# Patient Record
Sex: Male | Born: 1990 | Race: White | Hispanic: No | Marital: Single | Smoking: Never smoker
Health system: Southern US, Community
[De-identification: ages and names within clinical notes are randomized; demographics above are authoritative.]

## PROBLEM LIST (undated history)

## (undated) HISTORY — PX: SPLENECTOMY: SUR1306

## (undated) HISTORY — PX: ABDOMINAL SURGERY: SHX537

---
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
## 2010-05-20 ENCOUNTER — Emergency Department: Payer: Self-pay | Admitting: Emergency Medicine
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
## 2011-04-27 ENCOUNTER — Emergency Department: Payer: Self-pay | Admitting: Emergency Medicine

## 2017-05-13 ENCOUNTER — Emergency Department: Payer: Medicaid Other

## 2017-05-13 ENCOUNTER — Encounter: Payer: Self-pay | Admitting: Emergency Medicine

## 2017-05-13 ENCOUNTER — Emergency Department
Admission: EM | Admit: 2017-05-13 | Discharge: 2017-05-13 | Disposition: A | Discharge status: Home / Self Care | Payer: Medicaid Other | Attending: Emergency Medicine | Admitting: Emergency Medicine

## 2017-05-13 DIAGNOSIS — Y9389 Activity, other specified: Secondary | ICD-10-CM | POA: Diagnosis not present

## 2017-05-13 DIAGNOSIS — W230XXA Caught, crushed, jammed, or pinched between moving objects, initial encounter: Secondary | ICD-10-CM | POA: Diagnosis not present

## 2017-05-13 DIAGNOSIS — Z23 Encounter for immunization: Secondary | ICD-10-CM | POA: Diagnosis not present

## 2017-05-13 DIAGNOSIS — Y92009 Unspecified place in unspecified non-institutional (private) residence as the place of occurrence of the external cause: Secondary | ICD-10-CM | POA: Insufficient documentation

## 2017-05-13 DIAGNOSIS — Y998 Other external cause status: Secondary | ICD-10-CM | POA: Insufficient documentation

## 2017-05-13 DIAGNOSIS — S61213A Laceration without foreign body of left middle finger without damage to nail, initial encounter: Secondary | ICD-10-CM | POA: Diagnosis not present

## 2017-05-13 DIAGNOSIS — S6992XA Unspecified injury of left wrist, hand and finger(s), initial encounter: Secondary | ICD-10-CM | POA: Diagnosis present

## 2017-05-13 DIAGNOSIS — T148XXA Other injury of unspecified body region, initial encounter: Secondary | ICD-10-CM

## 2017-05-13 MED ORDER — TETANUS-DIPHTH-ACELL PERTUSSIS 5-2.5-18.5 LF-MCG/0.5 IM SUSP
0.5000 mL | Freq: Once | INTRAMUSCULAR | Status: AC
  Administered 2017-05-13: 0.5 mL via INTRAMUSCULAR

## 2017-05-13 MED ORDER — CEPHALEXIN 500 MG PO CAPS: 500.0000 mg | ORAL_CAPSULE | Freq: Four times a day (QID) | ORAL | 0 refills | Status: AC

## 2017-05-13 MED ORDER — MELOXICAM 7.5 MG PO TABS: 7.5000 mg | ORAL_TABLET | Freq: Every day | ORAL | 1 refills | Status: AC

## 2017-05-13 MED ORDER — TETANUS-DIPHTH-ACELL PERTUSSIS 5-2.5-18.5 LF-MCG/0.5 IM SUSP
INTRAMUSCULAR | Status: AC
  Administered 2017-05-13: 0.5 mL via INTRAMUSCULAR
  Filled 2017-05-13: qty 0.5

## 2017-05-13 NOTE — ED Triage Notes (Signed)
Patient presents to the ED with injured middle finger on his left hand.  Patient states, "I was doing some work around my house with a bobcat and it trapped my finger and I pulled it away."  Patient is in no obvious distress at this time.

## 2017-05-13 NOTE — ED Notes (Signed)
Pt discharged to home.  Family member driving.  Discharge instructions reviewed.  Verbalized understanding.  No questions or concerns at this time.  Teach back verified.  Pt in NAD.  No items left in ED.  Signature pad not working.

## 2017-05-13 NOTE — ED Provider Notes (Signed)
[facility] Emergency Department Provider Note  ____________________________________________  Time seen: Approximately 4:02 PM  I have reviewed the triage vital signs and the nursing notes.   HISTORY  Chief Complaint Finger Injury    HPI Barry Fitzgerald is a 26 y.o. male presents to the emergency department with left middle finger pain. Patient states that he accidentally caught his left middle finger in a bobcat resulting in a 1 cm skin avulsion. Patient applied a clean dressing but has attempted no other alleviating measures. Patient denies weakness of the left hand.   History reviewed. No pertinent past medical history.  There are no active problems to display for this patient.   History reviewed. No pertinent surgical history.  Prior to Admission medications   Medication Sig Start Date End Date Taking? Authorizing Provider  cephALEXin (KEFLEX) 500 MG capsule Take 1 capsule (500 mg total) by mouth 4 (four) times daily. 05/13/17 05/23/17  Orvil FeilWoods, Maylyn Narvaiz M, PA-C  meloxicam (MOBIC) 7.5 MG tablet Take 1 tablet (7.5 mg total) by mouth daily. 05/13/17 05/20/17  Orvil FeilWoods, Nyiah Pianka M, PA-C    Allergies Patient has no known allergies.  No family history on file.  Social History Social History  Substance Use Topics  . Smoking status: Never Smoker  . Smokeless tobacco: Never Used  . Alcohol use No     Review of Systems  Constitutional: No fever/chills Eyes: No visual changes. No discharge ENT: No upper respiratory complaints. Cardiovascular: no chest pain. Respiratory: no cough. No SOB. Musculoskeletal: Patient has left middle finger pain.  Skin: Patient has 1 cm skin avulsion of left middle finger. Neurological: Negative for headaches, focal weakness or numbness.   ____________________________________________   PHYSICAL EXAM:  VITAL SIGNS: ED Triage Vitals  Enc Vitals Group     BP 05/13/17 1338 113/69     Pulse Rate 05/13/17 1338 (!) 55     Resp  05/13/17 1338 16     Temp 05/13/17 1338 97.7 F (36.5 C)     Temp Source 05/13/17 1338 Oral     SpO2 05/13/17 1338 100 %     Weight 05/13/17 1339 170 lb (77.1 kg)     Height 05/13/17 1339 6' (1.829 m)     Head Circumference --      Peak Flow --      Pain Score 05/13/17 1337 8     Pain Loc --      Pain Edu? --      Excl. in GC? --      Constitutional: Alert and oriented. Well appearing and in no acute distress. Eyes: Conjunctivae are normal. PERRL. EOMI. Head: Atraumatic. Cardiovascular: Normal rate, regular rhythm. Normal S1 and S2.  Good peripheral circulation. Respiratory: Normal respiratory effort without tachypnea or retractions. Lungs CTAB. Good air entry to the bases with no decreased or absent breath sounds. Musculoskeletal: Patient is able to move all 5 left fingers. Patient is able to perform resisted flexion and extension at the PIP and DIP joints of the left middle finger. Palpable radial and ulnar pulses bilaterally and symmetrically. Neurologic:  Normal speech and language. No gross focal neurologic deficits are appreciated.  Skin: She has 1 cm skin avulsion of left middle finger. Psychiatric: Mood and affect are normal. Speech and behavior are normal. Patient exhibits appropriate insight and judgement.   ____________________________________________   LABS (all labs ordered are listed, but only abnormal results are displayed)  Labs Reviewed - No data to display ____________________________________________  EKG  ____________________________________________  RADIOLOGY  Geraldo Pitter, personally viewed and evaluated these images (plain radiographs) as part of my medical decision making, as well as reviewing the written report by the radiologist.  Dg Finger Middle Left  Result Date: 05/13/2017 CLINICAL DATA:  Patient presents to the ED with injured middle finger on his left hand. Patient states, "I was doing some work around my house with a bobcat and it  trapped my finger and I pulled it away." Patient is in no obvious distress at this time. EXAM: LEFT MIDDLE FINGER 2+V COMPARISON:  None FINDINGS: There is no evidence of fracture or dislocation. There is no evidence of arthropathy or other focal bone abnormality. Soft tissues are unremarkable. IMPRESSION: No acute osseous injury of the left third digit. Electronically Signed   By: Elige Ko   On: 05/13/2017 14:20    ____________________________________________    PROCEDURES  Procedure(s) performed:    Procedures    Medications - No data to display   ____________________________________________   INITIAL IMPRESSION / ASSESSMENT AND PLAN / ED COURSE  Pertinent labs & imaging results that were available during my care of the patient were reviewed by me and considered in my medical decision making (see chart for details).  Review of the [place] CSRS was performed in accordance of the [facility] prior to dispensing any controlled drugs.    Assessment and plan Avulsion Patient presents to the emergency department with left middle finger pain after he caught finger in a bobcat. Physical exam was reassuring. DG left middle finger revealed no acute bony abnormality. Basic wound care was provided in the emergency department. Patient's tetanus status was updated in the emergency department. Patient was discharged with Keflex. Vital signs were reassuring prior to discharge. All patient questions were answered.   ____________________________________________  FINAL CLINICAL IMPRESSION(S) / ED DIAGNOSES  Final diagnoses:  Skin avulsion      NEW MEDICATIONS STARTED DURING THIS VISIT:  New Prescriptions   CEPHALEXIN (KEFLEX) 500 MG CAPSULE    Take 1 capsule (500 mg total) by mouth 4 (four) times daily.   MELOXICAM (MOBIC) 7.5 MG TABLET    Take 1 tablet (7.5 mg total) by mouth daily.        This chart was dictated using voice recognition software/Dragon. Despite best efforts to  proofread, errors can occur which can change the meaning. Any change was purely unintentional.    Orvil Feil, PA-C 05/13/17 1611    Merrily Brittle, MD 05/13/17 [phone]

## 2017-08-02 ENCOUNTER — Emergency Department
Admission: EM | Admit: 2017-08-02 | Discharge: 2017-08-02 | Disposition: A | Discharge status: Home / Self Care | Payer: Medicaid Other | Attending: Emergency Medicine | Admitting: Emergency Medicine

## 2017-08-02 ENCOUNTER — Encounter: Payer: Self-pay | Admitting: Emergency Medicine

## 2017-08-02 DIAGNOSIS — R600 Localized edema: Secondary | ICD-10-CM | POA: Insufficient documentation

## 2017-08-02 DIAGNOSIS — K0889 Other specified disorders of teeth and supporting structures: Secondary | ICD-10-CM | POA: Diagnosis not present

## 2017-08-02 DIAGNOSIS — R220 Localized swelling, mass and lump, head: Secondary | ICD-10-CM

## 2017-08-02 MED ORDER — TRAMADOL HCL 50 MG PO TABS: 50.0000 mg | ORAL_TABLET | Freq: Four times a day (QID) | ORAL | 0 refills | Status: DC | PRN

## 2017-08-02 MED ORDER — AMOXICILLIN 875 MG PO TABS: 875.0000 mg | ORAL_TABLET | Freq: Two times a day (BID) | ORAL | 0 refills | Status: DC

## 2017-08-02 MED ORDER — IBUPROFEN 800 MG PO TABS: 800.0000 mg | ORAL_TABLET | Freq: Three times a day (TID) | ORAL | 0 refills | Status: DC | PRN

## 2017-08-02 MED ORDER — TRAMADOL HCL 50 MG PO TABS
50.0000 mg | ORAL_TABLET | Freq: Once | ORAL | Status: AC
  Administered 2017-08-02: 50 mg via ORAL
  Filled 2017-08-02: qty 1

## 2017-08-02 MED ORDER — AMOXICILLIN 500 MG PO CAPS
500.0000 mg | ORAL_CAPSULE | Freq: Once | ORAL | Status: AC
  Administered 2017-08-02: 500 mg via ORAL
  Filled 2017-08-02: qty 1

## 2017-08-02 NOTE — ED Provider Notes (Signed)
[facility] EMERGENCY DEPARTMENT Provider Note   CSN: [id number] Arrival date & time: 08/02/17  2010     History   Chief Complaint Chief Complaint  Patient presents with  . Oral Swelling    HPI Barry Fitzgerald is a 26 y.o. male presents to the emergency department for evaluation of left-sided facial swelling.  Patient has swelling on his left lower jaw.  He has a cracked left second lower molar that is been broken for 1 year.  Patient states he developed pain yesterday but today developed swelling and increased pain.  Pain is currently 6 out of 10.  No fevers or difficulty swallowing.  He denies any chest pain, shortness of breath.  No tongue swelling.  He is not take any medications for pain or antibiotics.  He has a dental appointment on Monday.  HPI  History reviewed. No pertinent past medical history.  There are no active problems to display for this patient.   History reviewed. No pertinent surgical history.     Home Medications    Prior to Admission medications   Medication Sig Start Date End Date Taking? Authorizing Provider  amoxicillin (AMOXIL) 875 MG tablet Take 1 tablet (875 mg total) by mouth 2 (two) times daily. X 10 days 08/02/17   Evon Slack, PA-C  ibuprofen (ADVIL,MOTRIN) 800 MG tablet Take 1 tablet (800 mg total) by mouth every 8 (eight) hours as needed. 08/02/17   Evon Slack, PA-C  traMADol (ULTRAM) 50 MG tablet Take 1 tablet (50 mg total) by mouth every 6 (six) hours as needed. 08/02/17   Evon Slack, PA-C    Family History No family history on file.  Social History Social History  Substance Use Topics  . Smoking status: Never Smoker  . Smokeless tobacco: Never Used  . Alcohol use No     Allergies   Patient has no known allergies.   Review of Systems Review of Systems  Constitutional: Negative.  Negative for chills and fever.  HENT: Positive for dental problem and facial swelling. Negative for drooling,  mouth sores, trouble swallowing and voice change.   Respiratory: Negative for shortness of breath.   Cardiovascular: Negative for chest pain.  Gastrointestinal: Negative for nausea and vomiting.  Musculoskeletal: Negative for arthralgias, neck pain and neck stiffness.  Skin: Negative.   Psychiatric/Behavioral: Negative for confusion.  All other systems reviewed and are negative.    Physical Exam Updated Vital Signs BP 123/64 (BP Location: Left Arm)   Pulse 60   Temp 98.4 F (36.9 C) (Oral)   Resp 20   Ht 6' (1.829 m)   Wt 77.1 kg (170 lb)   SpO2 99%   BMI 23.06 kg/m   Physical Exam  Constitutional: He is oriented to person, place, and time. He appears well-developed and well-nourished.  HENT:  Head: Normocephalic and atraumatic.  Right Ear: External ear normal.  Left Ear: External ear normal.  Nose: Nose normal.  Mouth/Throat: No oropharyngeal exudate.  No intraoral lesions, abscess.  Left lower second molar is fractured with no surrounding erythema or swelling.  Left lower second molar is tender to palpation.  Eyes: Conjunctivae are normal.  Neck: Normal range of motion.  Cardiovascular: Normal rate.   Pulmonary/Chest: Effort normal. No respiratory distress.  Musculoskeletal: Normal range of motion.  Lymphadenopathy:    He has no cervical adenopathy.  Neurological: He is alert and oriented to person, place, and time.  Skin: Skin is warm. No rash noted.  Psychiatric: He has a normal mood and affect. His behavior is normal. Thought content normal.     ED Treatments / Results  Labs (all labs ordered are listed, but only abnormal results are displayed) Labs Reviewed - No data to display  EKG  EKG Interpretation None       Radiology No results found.  Procedures Procedures (including critical care time)  Medications Ordered in ED Medications  amoxicillin (AMOXIL) capsule 500 mg (500 mg Oral Given 08/02/17 2047)  traMADol (ULTRAM) tablet 50 mg (50 mg Oral  Given 08/02/17 2048)     Initial Impression / Assessment and Plan / ED Course  I have reviewed the triage vital signs and the nursing notes.  Pertinent labs & imaging results that were available during my care of the patient were reviewed by me and considered in my medical decision making (see chart for details).     26 year old male with left lower second molar dental pain with infection.  He is started on amoxicillin and given tramadol and ibuprofen for pain relief.  He will follow-up with the dental clinic on Monday.  He is educated on signs and symptoms return to the ED for.  Final Clinical Impressions(s) / ED Diagnoses   Final diagnoses:  Left facial swelling  Pain, dental    New Prescriptions New Prescriptions   AMOXICILLIN (AMOXIL) 875 MG TABLET    Take 1 tablet (875 mg total) by mouth 2 (two) times daily. X 10 days   IBUPROFEN (ADVIL,MOTRIN) 800 MG TABLET    Take 1 tablet (800 mg total) by mouth every 8 (eight) hours as needed.   TRAMADOL (ULTRAM) 50 MG TABLET    Take 1 tablet (50 mg total) by mouth every 6 (six) hours as needed.     Ronnette JuniperGaines, Thomas C, PA-C 08/02/17 2107    Nita SickleVeronese, [place], MD 08/02/17 2155

## 2017-08-02 NOTE — ED Triage Notes (Signed)
Pt reports yesterday woke up with some discomfort to his mouth reports today at lunch noticed swelling to left side of mouth,reports pain to left lower tooth denies any other symptom pt talks in complete sentences no distress noted.

## 2017-08-02 NOTE — Discharge Instructions (Signed)
Please take antibiotics as prescribed.  Take tramadol and ibuprofen as needed for pain.  Return to the emergency department for any fevers, increasing pain or swelling.  Follow-up with dental clinic on Monday.

## 2017-08-03 ENCOUNTER — Encounter: Payer: Self-pay | Admitting: Emergency Medicine

## 2017-08-03 ENCOUNTER — Emergency Department
Admission: EM | Admit: 2017-08-03 | Discharge: 2017-08-03 | Disposition: A | Discharge status: Home / Self Care | Payer: Medicaid Other | Attending: Student in an Organized Health Care Education/Training Program | Admitting: Student in an Organized Health Care Education/Training Program

## 2017-08-03 DIAGNOSIS — Y929 Unspecified place or not applicable: Secondary | ICD-10-CM | POA: Diagnosis not present

## 2017-08-03 DIAGNOSIS — S025XXA Fracture of tooth (traumatic), initial encounter for closed fracture: Secondary | ICD-10-CM | POA: Diagnosis not present

## 2017-08-03 DIAGNOSIS — Y939 Activity, unspecified: Secondary | ICD-10-CM | POA: Insufficient documentation

## 2017-08-03 DIAGNOSIS — K0889 Other specified disorders of teeth and supporting structures: Secondary | ICD-10-CM

## 2017-08-03 DIAGNOSIS — Y998 Other external cause status: Secondary | ICD-10-CM | POA: Insufficient documentation

## 2017-08-03 DIAGNOSIS — Y33XXXA Other specified events, undetermined intent, initial encounter: Secondary | ICD-10-CM | POA: Insufficient documentation

## 2017-08-03 MED ORDER — IBUPROFEN 800 MG PO TABS
800.0000 mg | ORAL_TABLET | Freq: Once | ORAL | Status: AC
  Administered 2017-08-03: 800 mg via ORAL
  Filled 2017-08-03: qty 1

## 2017-08-03 MED ORDER — AMOXICILLIN 500 MG PO CAPS
500.0000 mg | ORAL_CAPSULE | Freq: Once | ORAL | Status: AC
  Administered 2017-08-03: 500 mg via ORAL
  Filled 2017-08-03: qty 1

## 2017-08-03 MED ORDER — OXYCODONE-ACETAMINOPHEN 5-325 MG PO TABS
1.0000 | ORAL_TABLET | Freq: Once | ORAL | Status: AC
  Administered 2017-08-03: 1 via ORAL
  Filled 2017-08-03: qty 1

## 2017-08-03 NOTE — ED Triage Notes (Signed)
States was seen for this, provided antibiotics and pain meds, told to recheck for cont swelling, here for recheck. Patient neg for sepsis screen.

## 2017-08-03 NOTE — ED Provider Notes (Signed)
[facility] Emergency Department Provider Note   ____________________________________________   First MD Initiated Contact with Patient 08/03/17 [phone]     (approximate)  I have reviewed the triage vital signs and the nursing notes.   HISTORY  Chief Complaint Dental Pain    HPI Barry Fitzgerald is a 26 y.o. male patient presents with dental pain and swelling to the left lower jaw. Patient was seen at this facility last night for same complaint and was given a prescription for antibiotics and pain medication. Patient state pharmacy was closed before he could get  the prescriptions filled. Patient awakened this morning with increased pain and swelling. Patient denies fever associated this complaint. Patient rates his pain as 10 over 10.  History reviewed. No pertinent past medical history.  There are no active problems to display for this patient.   History reviewed. No pertinent surgical history.  Prior to Admission medications   Medication Sig Start Date End Date Taking? Authorizing Provider  amoxicillin (AMOXIL) 875 MG tablet Take 1 tablet (875 mg total) by mouth 2 (two) times daily. X 10 days 08/02/17   Evon SlackGaines, Thomas C, PA-C  ibuprofen (ADVIL,MOTRIN) 800 MG tablet Take 1 tablet (800 mg total) by mouth every 8 (eight) hours as needed. 08/02/17   Evon SlackGaines, Thomas C, PA-C  traMADol (ULTRAM) 50 MG tablet Take 1 tablet (50 mg total) by mouth every 6 (six) hours as needed. 08/02/17   Evon SlackGaines, Thomas C, PA-C    Allergies Patient has no known allergies.  No family history on file.  Social History Social History  Substance Use Topics  . Smoking status: Never Smoker  . Smokeless tobacco: Never Used  . Alcohol use No    Review of Systems Constitutional: No fever/chills Eyes: No visual changes. ENT: No sore throat. Dental pain and swelling Cardiovascular: Denies chest pain. Respiratory: Denies shortness of breath. Gastrointestinal: No abdominal pain.  No  nausea, no vomiting.  No diarrhea.  No constipation. Genitourinary: Negative for dysuria. Musculoskeletal: Negative for back pain. Skin: Negative for rash. Neurological: Negative for headaches, focal weakness or numbness.   ____________________________________________   PHYSICAL EXAM:  VITAL SIGNS: ED Triage Vitals  Enc Vitals Group     BP 08/03/17 0703 115/72     Pulse Rate 08/03/17 0703 71     Resp 08/03/17 0703 18     Temp 08/03/17 0703 99.6 F (37.6 C)     Temp Source 08/03/17 0703 Oral     SpO2 08/03/17 0703 100 %     Weight 08/03/17 0706 170 lb (77.1 kg)     Height 08/03/17 0706 6' (1.829 m)     Head Circumference --      Peak Flow --      Pain Score 08/03/17 0703 10     Pain Loc --      Pain Edu? --      Excl. in GC? --    Constitutional: Alert and oriented. Well appearing and in no acute distress. Mouth/Throat: Mucous membranes are moist.  Oropharynx non-erythematous. Edematous and erythematous gingiva around fractured tooth #19 and 20. Neck: No stridor.  No cervical spine tenderness to palpation. Hematological/Lymphatic/Immunilogical: No cervical lymphadenopathy. Cardiovascular: Normal rate, regular rhythm. Grossly normal heart sounds.  Good peripheral circulation. Respiratory: Normal respiratory effort.  No retractions. Lungs CTAB. Neurologic:  Normal speech and language. No gross focal neurologic deficits are appreciated. No gait instability. Skin:  Skin is warm, dry and intact. No rash noted. Psychiatric: Mood and  affect are normal. Speech and behavior are normal.  ____________________________________________   LABS (all labs ordered are listed, but only abnormal results are displayed)  Labs Reviewed - No data to display ____________________________________________  EKG   ____________________________________________  RADIOLOGY  No results found.  ____________________________________________   PROCEDURES  Procedure(s) performed:  None  Procedures  Critical Care performed: No  ____________________________________________   INITIAL IMPRESSION / ASSESSMENT AND PLAN / ED COURSE  As part of my medical decision making, I reviewed the following data within the electronic MEDICAL RECORD NUMBER    Dental pain secondary to fractured tooth. Patient given discharge Instructions and advised to have prescriptions filled. Advised to follow-up with scheduled an appointment in 2 days.      ____________________________________________   FINAL CLINICAL IMPRESSION(S) / ED DIAGNOSES  Final diagnoses:  Closed fracture of tooth, initial encounter  Pain, dental      NEW MEDICATIONS STARTED DURING THIS VISIT:  New Prescriptions   No medications on file     Note:  This document was prepared using Dragon voice recognition software and may include unintentional dictation errors.    Joni Reining, PA-C 08/03/17 1096    Willy Eddy, MD 08/03/17 1041

## 2017-08-03 NOTE — ED Notes (Signed)
Pt calling for ride, before administering pain medications

## 2017-08-03 NOTE — ED Notes (Signed)
Pts ride came to room to pick pt up.

## 2018-03-31 IMAGING — DX DG FINGER MIDDLE 2+V*L*
3 series · 3 of 3 positions shown · non-contrast
Comparison: None

CLINICAL DATA: Patient presents to the ED with injured middle
finger on his left hand. Patient states, "I was doing some work
around my house with [REDACTED] and it trapped my finger and I pulled
it away." Patient is in no obvious distress at this time.

EXAM:
LEFT MIDDLE FINGER 2+V

[finger ap]
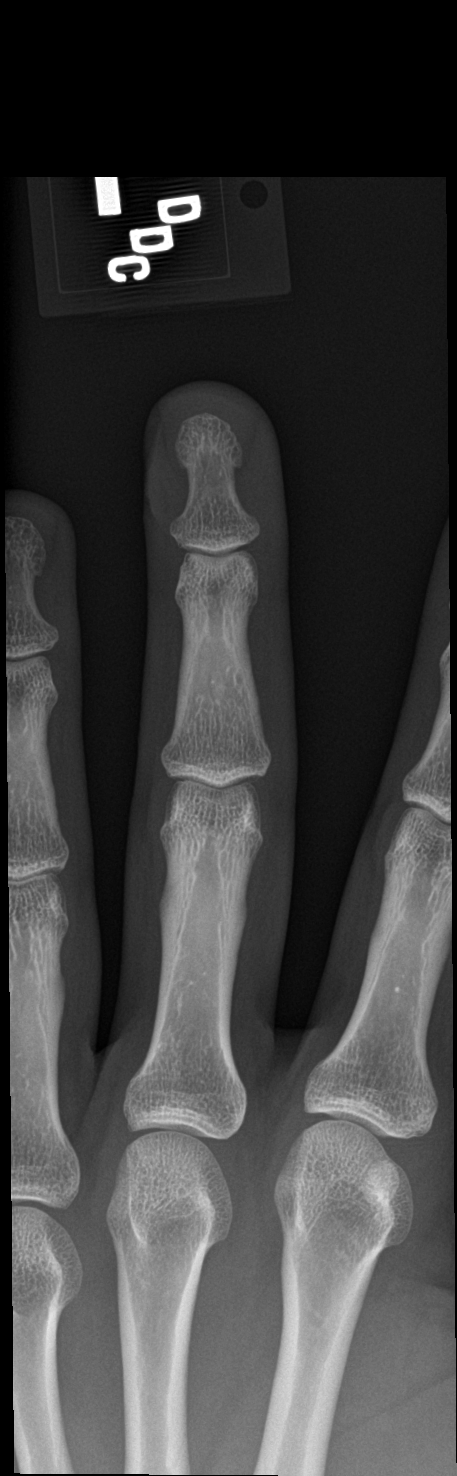

[finger obl]
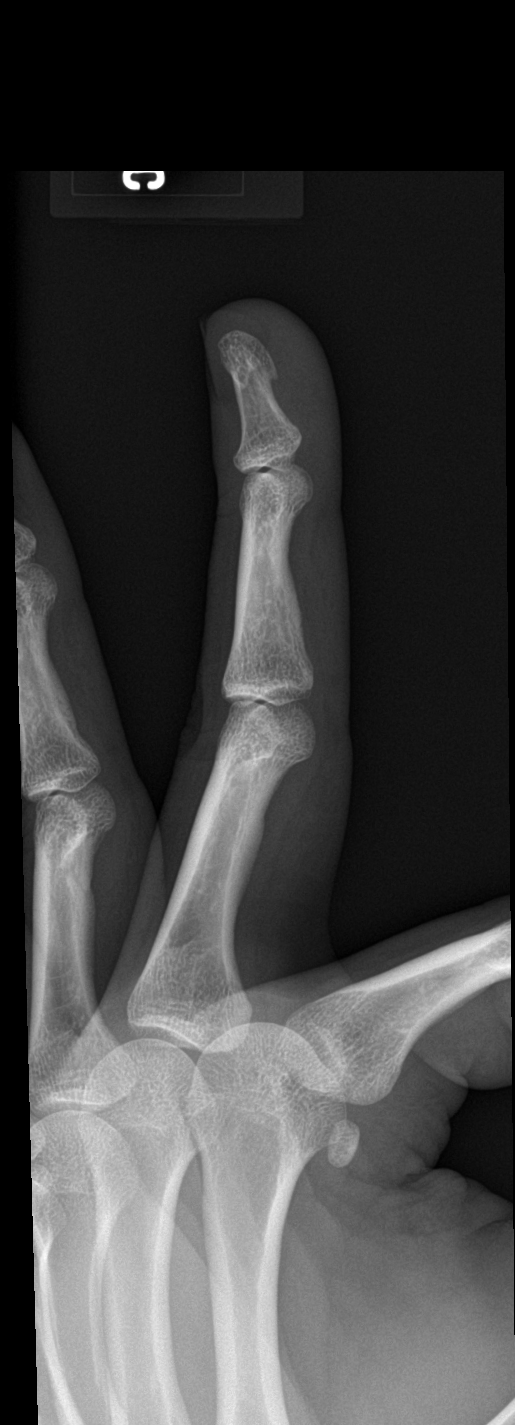

[finger lat]
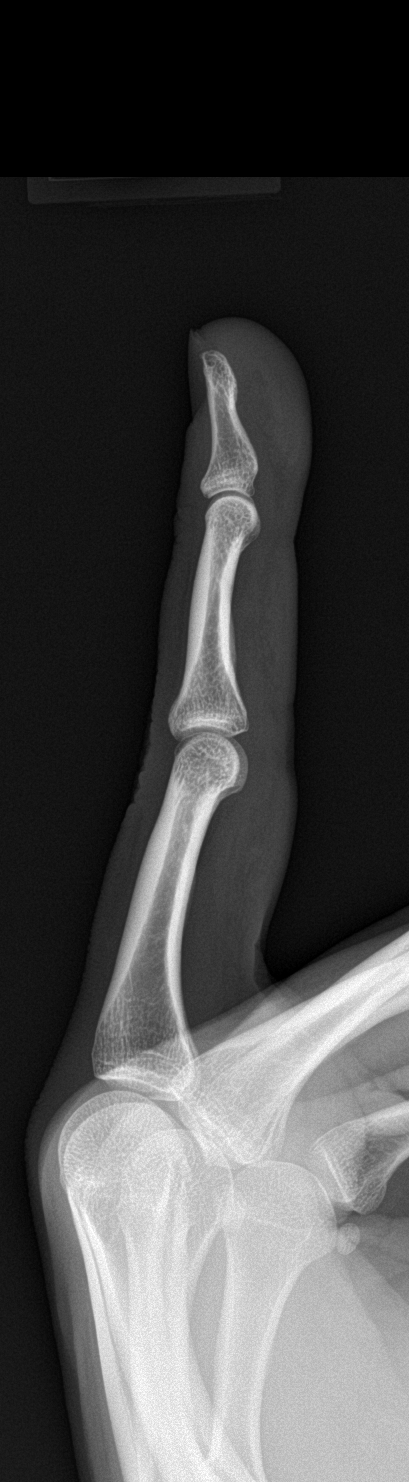

[3 of 3 positions shown; findings below may reference images not displayed]

FINDINGS: There is no evidence of fracture or dislocation. There is no
evidence of arthropathy or other focal bone abnormality. Soft
tissues are unremarkable.
IMPRESSION: No acute osseous injury of the left third digit.

## 2019-07-24 ENCOUNTER — Other Ambulatory Visit: Payer: Self-pay

## 2019-07-24 DIAGNOSIS — Z20822 Contact with and (suspected) exposure to covid-19: Secondary | ICD-10-CM

## 2019-07-26 LAB — NOVEL CORONAVIRUS, NAA: SARS-CoV-2, NAA: DETECTED — AB

## 2019-07-27 ENCOUNTER — Encounter: Payer: Self-pay | Admitting: *Deleted

## 2020-08-28 ENCOUNTER — Emergency Department
Admission: EM | Admit: 2020-08-28 | Discharge: 2020-08-28 | Disposition: A | Discharge status: Home / Self Care | Payer: Medicaid Other | Attending: Emergency Medicine | Admitting: Emergency Medicine

## 2020-08-28 ENCOUNTER — Other Ambulatory Visit: Payer: Self-pay

## 2020-08-28 ENCOUNTER — Encounter: Payer: Self-pay | Admitting: Emergency Medicine

## 2020-08-28 DIAGNOSIS — K0889 Other specified disorders of teeth and supporting structures: Secondary | ICD-10-CM | POA: Insufficient documentation

## 2020-08-28 MED ORDER — KETOROLAC TROMETHAMINE 10 MG PO TABS: 10.0000 mg | ORAL_TABLET | Freq: Four times a day (QID) | ORAL | 0 refills | Status: AC | PRN

## 2020-08-28 MED ORDER — KETOROLAC TROMETHAMINE 30 MG/ML IJ SOLN
30.0000 mg | Freq: Once | INTRAMUSCULAR | Status: AC
  Administered 2020-08-28: 22:00:00 30 mg via INTRAMUSCULAR
  Filled 2020-08-28: qty 1

## 2020-08-28 MED ORDER — AMOXICILLIN 875 MG PO TABS: 875.0000 mg | ORAL_TABLET | Freq: Two times a day (BID) | ORAL | 0 refills | Status: AC

## 2020-08-28 MED ORDER — AMOXICILLIN 500 MG PO CAPS
500.0000 mg | ORAL_CAPSULE | Freq: Once | ORAL | Status: AC
  Administered 2020-08-28: 22:00:00 500 mg via ORAL
  Filled 2020-08-28: qty 1

## 2020-08-28 NOTE — Discharge Instructions (Signed)
OPTIONS FOR DENTAL FOLLOW UP CARE ° °[place] Department of Health and Human Services - Local Safety Net Dental Clinics °http://www.ncdhhs.gov/dph/oralhealth/services/safetynetclinics.htm °  °[facility] (336-562-3123) ° °[facility] [place] ([phone]) ° °[facility] ([phone] ext [phone]) ° °[facility] ([phone]) ° °[facility] ([phone]) °This clinic caters to the indigent population and is on a lottery system. °Location: °[facility], [address], [place] °Clinic Hours: °Wednesdays from 6pm - 9pm, patients seen by a lottery system. °For dates, call or go to [email or web address]/[facility]/patients/[email or web address] °Services: °Cleanings, fillings and simple extractions. °Payment Options: °DENTAL WORK IS FREE OF CHARGE. Bring proof of income or support. °Best way to get seen: °Arrive at 5:15 pm - this is a lottery, NOT first come/first serve, so arriving earlier will not increase your chances of being seen. °  °  °[facility] °[phone] °Select option 1 for emergencies °  °Location: °[facility], [address], [place] °Clinic Hours: °No walk-ins accepted - call the day before to schedule an appointment. °Check in times are 9:30 am and 1:30 pm. °Services: °Simple extractions, temporary fillings, pulpectomy/pulp debridement, uncomplicated abscess drainage. °Payment Options: °PAYMENT IS DUE AT THE TIME OF SERVICE.  Fee is usually $100-200, additional surgical procedures (e.g. abscess drainage) may be extra. °Cash, checks, Visa/MasterCard accepted.  Can file Medicaid if patient is covered for dental - patient should call case worker to check. °No discount for [facility] patients. °Best way to get seen: °MUST call the day before and get onto the schedule. Can usually be seen the next 1-2 days. No walk-ins accepted. °  °  °[facility] °[phone] °   °Location: °[facility], [address], [place] °Clinic Hours: °M, W, Th, F 8am or 1:30pm, Tues 9a or 1:30 - first come/first served. °Services: °Simple extractions, temporary fillings, uncomplicated abscess drainage.  You do not need to be an [place] resident. °Payment Options: °PAYMENT IS DUE AT THE TIME OF SERVICE. °Dental insurance, otherwise sliding scale - bring proof of income or support. °Depending on income and treatment needed, cost is usually $50-200. °Best way to get seen: °Arrive early as it is first come/first served. °  °  °[facility] °[phone] °  °Location: °[address] °Clinic Hours: °Mon-Thu 8a-5p °Services: °Most basic dental services including extractions and fillings. °Payment Options: °PAYMENT IS DUE AT THE TIME OF SERVICE. °Sliding scale, up to 50% off - bring proof if income or support. °Medicaid with dental option accepted. °Best way to get seen: °Call to schedule an appointment, can usually be seen within 2 weeks OR they will try to see walk-ins - show up at 8a or 2p (you may have to wait). °  °  °[facility] °[phone] °[place] RESIDENTS ONLY °  °Location: °[facility], [address], [place], [place] [address] °Clinic Hours: By appointment only. °Monday - Thursday 8am-5pm, Friday 8am-12pm °Services: Cleanings, fillings, extractions. °Payment Options: °PAYMENT IS DUE AT THE TIME OF SERVICE. °Cash, Visa or MasterCard. Sliding scale - $30 minimum per service. °Best way to get seen: °Come in to office, complete packet and make an appointment - need proof of income °or support monies for each household member and proof of [place] residence. °Usually takes about a month to get in. °  °  °[facility] °[phone] °  °Location: °[address]   [place] °Clinic Hours: Walk-in Urgent Care Dental Services are offered Monday-Friday  mornings only. °The numbers of emergencies accepted daily is limited to the number of °providers available. °Maximum 15 - Mondays, Wednesdays & Thursdays °Maximum 10 - Tuesdays & Fridays °Services: °You do not need to be a [place] resident to be seen for a dental emergency. °Emergencies are defined as pain, swelling, abnormal bleeding, or dental trauma. Walkins will receive x-rays if needed. °NOTE: Dental cleaning is not an emergency. °Payment Options: °PAYMENT IS DUE AT THE TIME OF SERVICE. °Minimum co-pay is $40.00 for uninsured patients. °Minimum co-pay is $3.00 for Medicaid with dental coverage. °Dental Insurance is accepted and must be presented at time of visit. °Medicare does not cover dental. °Forms of payment: Cash, credit card, checks. °Best way to get seen: °If not previously registered with the clinic, walk-in dental registration begins at 7:15 am and is on a first come/first serve basis. °If previously registered with the clinic, call to make an appointment. °  °  °[facility] °[phone] °[place] RESIDENTS ONLY °  °Location: °[address], [place], [place] °Clinic Hours: °Mon-Thu 10a-2p °Services: Extractions only! °Payment Options: °FREE (donations accepted) - bring proof of income or support °Best way to get seen: °Call and schedule an appointment OR come at 8am on the 1st Monday of every month (except for holidays) when it is first come/first served. °  °  °[facility] °[phone] °  °Location: °[address], [place] °Clinic Hours: °Friday mornings °Services, Payment Options, Best way to get seen: °Call for info °

## 2020-08-28 NOTE — ED Triage Notes (Signed)
Pt arrived via POV with reports of R side dental pain since Friday, pt states he has 3 bad teeth on that side.

## 2020-08-28 NOTE — ED Notes (Signed)
Pt has lower right sided dental pain x 3 days. Pt states he has a hx of cavities on that side. Pt A&O x 4 and ambulatory on arrival.

## 2020-08-28 NOTE — ED Provider Notes (Signed)
Emergency Department Provider Note  ____________________________________________  Time seen: Approximately 10:39 PM  I have reviewed the triage vital signs and the nursing notes.   HISTORY  Chief Complaint Dental Pain   Historian Patient    HPI Barry Fitzgerald is a 29 y.o. male presents to the emergency department with right lower jaw pain.  Patient has a broken inferior 31.  Patient states that he has made an appointment with a local dentist on Tuesday.  He has had no difficulty swallowing or pain underneath the tongue.  No fever or chills at home.  He has noticed some mild swelling along the right lower jaw.   History reviewed. No pertinent past medical history.   Immunizations up to date:  Yes.     History reviewed. No pertinent past medical history.  There are no problems to display for this patient.   Past Surgical History:  Procedure Laterality Date   ABDOMINAL SURGERY     SPLENECTOMY      Prior to Admission medications   Medication Sig Start Date End Date Taking? Authorizing Provider  amoxicillin (AMOXIL) 875 MG tablet Take 1 tablet (875 mg total) by mouth 2 (two) times daily for 10 days. 08/28/20 09/07/20  Orvil Feil, PA-C  ibuprofen (ADVIL,MOTRIN) 800 MG tablet Take 1 tablet (800 mg total) by mouth every 8 (eight) hours as needed. 08/02/17   Evon Slack, PA-C  ketorolac (TORADOL) 10 MG tablet Take 1 tablet (10 mg total) by mouth every 6 (six) hours as needed for up to 5 days. 08/28/20 09/02/20  Orvil Feil, PA-C  traMADol (ULTRAM) 50 MG tablet Take 1 tablet (50 mg total) by mouth every 6 (six) hours as needed. 08/02/17   Evon Slack, PA-C    Allergies Patient has no known allergies.  No family history on file.  Social History Social History   Tobacco Use   Smoking status: Never Smoker   Smokeless tobacco: Never Used  Building services engineer Use: Never used  Substance Use Topics   Alcohol use: No   Drug use: No     Review of  Systems  Constitutional: No fever/chills Eyes:  No discharge ENT: No upper respiratory complaints. Respiratory: no cough. No SOB/ use of accessory muscles to breath Gastrointestinal:   No nausea, no vomiting.  No diarrhea.  No constipation. Musculoskeletal: Negative for musculoskeletal pain. Skin: Negative for rash, abrasions, lacerations, ecchymosis.   ____________________________________________   PHYSICAL EXAM:  VITAL SIGNS: ED Triage Vitals  Enc Vitals Group     BP 08/28/20 2115 137/78     Pulse Rate 08/28/20 2115 60     Resp 08/28/20 2115 16     Temp 08/28/20 2115 98.8 F (37.1 C)     Temp Source 08/28/20 2115 Oral     SpO2 08/28/20 2115 98 %     Weight 08/28/20 2116 170 lb (77.1 kg)     Height 08/28/20 2116 6' (1.829 m)     Head Circumference --      Peak Flow --      Pain Score 08/28/20 2116 10     Pain Loc --      Pain Edu? --      Excl. in GC? --      Constitutional: Alert and oriented. Well appearing and in no acute distress. Eyes: Conjunctivae are normal. PERRL. EOMI. Head: Atraumatic. ENT:      Nose: No congestion/rhinnorhea.      Mouth/Throat: Mucous membranes are  moist.  Inferior 31 is broken.  Patient has mild swelling of the right lower jaw.  No tenderness to palpation underneath the tongue. Neck: No stridor.  No cervical spine tenderness to palpation. Cardiovascular: Normal rate, regular rhythm. Normal S1 and S2.  Good peripheral circulation. Respiratory: Normal respiratory effort without tachypnea or retractions. Lungs CTAB. Good air entry to the bases with no decreased or absent breath sounds Gastrointestinal: Bowel sounds x 4 quadrants. Soft and nontender to palpation. No guarding or rigidity. No distention. Musculoskeletal: Full range of motion to all extremities. No obvious deformities noted Neurologic:  Normal for age. No gross focal neurologic deficits are appreciated.  Skin:  Skin is warm, dry and intact. No rash noted. Psychiatric: Mood and  affect are normal for age. Speech and behavior are normal.   ____________________________________________   LABS (all labs ordered are listed, but only abnormal results are displayed)  Labs Reviewed - No data to display ____________________________________________  EKG   ____________________________________________  RADIOLOGY   No results found.  ____________________________________________    PROCEDURES  Procedure(s) performed:     Procedures     Medications  ketorolac (TORADOL) 30 MG/ML injection 30 mg (30 mg Intramuscular Given 08/28/20 2203)  amoxicillin (AMOXIL) capsule 500 mg (500 mg Oral Given 08/28/20 2203)     ____________________________________________   INITIAL IMPRESSION / ASSESSMENT AND PLAN / ED COURSE  Pertinent labs & imaging results that were available during my care of the patient were reviewed by me and considered in my medical decision making (see chart for details).      Assessment and plan Dental pain 29 year old male presents to the emergency department with right-sided dental pain.  On exam, patient had a broken inferior 31.  Patient was given an injection of Toradol in the emergency department and was discharged with amoxicillin.  Patient was advised to keep appointment with local dentist on Tuesday.  Return precautions were given to return with new or worsening symptoms    ____________________________________________  FINAL CLINICAL IMPRESSION(S) / ED DIAGNOSES  Final diagnoses:  Pain, dental      NEW MEDICATIONS STARTED DURING THIS VISIT:  ED Discharge Orders         Ordered    amoxicillin (AMOXIL) 875 MG tablet  2 times daily        08/28/20 2229    ketorolac (TORADOL) 10 MG tablet  Every 6 hours PRN        08/28/20 2229              This chart was dictated using voice recognition software/Dragon. Despite best efforts to proofread, errors can occur which can change the meaning. Any change was purely  unintentional.     Orvil Feil, PA-C 08/28/20 2246    Jene Every, MD 08/28/20 2249

## 2021-04-13 ENCOUNTER — Encounter: Payer: Self-pay | Admitting: Emergency Medicine

## 2021-04-13 ENCOUNTER — Emergency Department: Payer: Medicaid Other

## 2021-04-13 ENCOUNTER — Other Ambulatory Visit: Payer: Self-pay

## 2021-04-13 ENCOUNTER — Emergency Department
Admission: EM | Admit: 2021-04-13 | Discharge: 2021-04-13 | Disposition: A | Discharge status: Home / Self Care | Payer: Medicaid Other | Attending: Emergency Medicine | Admitting: Emergency Medicine

## 2021-04-13 DIAGNOSIS — R0789 Other chest pain: Secondary | ICD-10-CM | POA: Insufficient documentation

## 2021-04-13 DIAGNOSIS — R079 Chest pain, unspecified: Secondary | ICD-10-CM

## 2021-04-13 DIAGNOSIS — R0602 Shortness of breath: Secondary | ICD-10-CM | POA: Insufficient documentation

## 2021-04-13 DIAGNOSIS — R110 Nausea: Secondary | ICD-10-CM | POA: Insufficient documentation

## 2021-04-13 DIAGNOSIS — R42 Dizziness and giddiness: Secondary | ICD-10-CM | POA: Insufficient documentation

## 2021-04-13 LAB — CBC
HCT: 38 % — ABNORMAL LOW (ref 39.0–52.0)
Hemoglobin: 12.5 g/dL — ABNORMAL LOW (ref 13.0–17.0)
MCH: 23.6 pg — ABNORMAL LOW (ref 26.0–34.0)
MCHC: 32.9 g/dL (ref 30.0–36.0)
MCV: 71.8 fL — ABNORMAL LOW (ref 80.0–100.0)
Platelets: 328 10*3/uL (ref 150–400)
RBC: 5.29 MIL/uL (ref 4.22–5.81)
RDW: 14.5 % (ref 11.5–15.5)
WBC: 7.1 10*3/uL (ref 4.0–10.5)
nRBC: 0 % (ref 0.0–0.2)

## 2021-04-13 LAB — BASIC METABOLIC PANEL
Anion gap: 8 (ref 5–15)
BUN: 10 mg/dL (ref 6–20)
CO2: 27 mmol/L (ref 22–32)
Calcium: 9.6 mg/dL (ref 8.9–10.3)
Chloride: 107 mmol/L (ref 98–111)
Creatinine, Ser: 0.93 mg/dL (ref 0.61–1.24)
GFR, Estimated: >60 mL/min (ref >60)
Glucose, Bld: 109 mg/dL — ABNORMAL HIGH (ref 70–99)
Potassium: 3.2 mmol/L — ABNORMAL LOW (ref 3.5–5.1)
Sodium: 142 mmol/L (ref 135–145)

## 2021-04-13 LAB — TROPONIN I (HIGH SENSITIVITY): Troponin I (High Sensitivity): <2 ng/L (ref <18)

## 2021-04-13 MED ORDER — POTASSIUM CHLORIDE CRYS ER 20 MEQ PO TBCR
40.0000 meq | EXTENDED_RELEASE_TABLET | Freq: Once | ORAL | Status: AC
  Administered 2021-04-13: 40 meq via ORAL
  Filled 2021-04-13: qty 2

## 2021-04-13 MED ORDER — ONDANSETRON 4 MG PO TBDP
4.0000 mg | ORAL_TABLET | Freq: Once | ORAL | Status: AC
  Administered 2021-04-13: 4 mg via ORAL
  Filled 2021-04-13: qty 1

## 2021-04-13 MED ORDER — ONDANSETRON 4 MG PO TBDP: 4.0000 mg | ORAL_TABLET | Freq: Three times a day (TID) | ORAL | 0 refills | Status: DC | PRN

## 2021-04-13 NOTE — ED Provider Notes (Signed)
[facility] Emergency Department Provider Note   ____________________________________________   Event Date/Time   First MD Initiated Contact with Patient 04/13/21 2131     (approximate)  I have reviewed the triage vital signs and the nursing notes.   HISTORY  Chief Complaint Chest Pain, Shortness of Breath, and Dizziness    HPI Barry Fitzgerald is a 30 y.o. male with no significant past medical history who presents to the ED complaining of chest pain.  Patient reports that he has been dealing with intermittent pressure in the right side of his chest for the past 3 to 4 days that has become constant for the past 2 days.  He states he will occasionally feel short of breath and lightheaded, but currently denies any difficulty breathing.  He has not had any fevers or cough, denies pain or swelling in his legs.  He has never had similar symptoms in the past, denies any recent trauma to his chest.  He has not taken anything for his symptoms prior to arrival.  He does state that it has been difficult for him to eat and drink recently due to nausea.        History reviewed. No pertinent past medical history.  There are no problems to display for this patient.   Past Surgical History:  Procedure Laterality Date   ABDOMINAL SURGERY     SPLENECTOMY      Prior to Admission medications   Medication Sig Start Date End Date Taking? Authorizing Provider  ondansetron (ZOFRAN ODT) 4 MG disintegrating tablet Take 1 tablet (4 mg total) by mouth every 8 (eight) hours as needed for nausea or vomiting. 04/13/21  Yes Chesley Noon, MD  ibuprofen (ADVIL,MOTRIN) 800 MG tablet Take 1 tablet (800 mg total) by mouth every 8 (eight) hours as needed. 08/02/17   Evon Slack, PA-C  traMADol (ULTRAM) 50 MG tablet Take 1 tablet (50 mg total) by mouth every 6 (six) hours as needed. 08/02/17   Evon Slack, PA-C    Allergies Patient has no known allergies.  No family history  on file.  Social History Social History   Tobacco Use   Smoking status: Never   Smokeless tobacco: Never  Vaping Use   Vaping Use: Never used  Substance Use Topics   Alcohol use: No   Drug use: No    Review of Systems  Constitutional: No fever/chills Eyes: No visual changes. ENT: No sore throat. Cardiovascular: Positive for chest pain.  Positive for lightheadedness. Respiratory: Positive for shortness of breath. Gastrointestinal: No abdominal pain.  Positive for nausea, no vomiting.  No diarrhea.  No constipation. Genitourinary: Negative for dysuria. Musculoskeletal: Negative for back pain. Skin: Negative for rash. Neurological: Negative for headaches, focal weakness or numbness.  ____________________________________________   PHYSICAL EXAM:  VITAL SIGNS: ED Triage Vitals  Enc Vitals Group     BP 04/13/21 1839 129/75     Pulse Rate 04/13/21 1839 62     Resp 04/13/21 1839 17     Temp 04/13/21 1839 98.5 F (36.9 C)     Temp Source 04/13/21 1839 Oral     SpO2 04/13/21 1839 99 %     Weight 04/13/21 1834 170 lb (77.1 kg)     Height 04/13/21 1834 6' (1.829 m)     Head Circumference --      Peak Flow --      Pain Score 04/13/21 1834 8     Pain Loc --  Pain Edu? --      Excl. in GC? --     Constitutional: Alert and oriented. Eyes: Conjunctivae are normal. Head: Atraumatic. Nose: No congestion/rhinnorhea. Mouth/Throat: Mucous membranes are moist. Neck: Normal ROM Cardiovascular: Normal rate, regular rhythm. Grossly normal heart sounds.  2+ radial pulses bilaterally. Respiratory: Normal respiratory effort.  No retractions. Lungs CTAB.  No chest wall tenderness to palpation. Gastrointestinal: Soft and nontender. No distention. Genitourinary: deferred Musculoskeletal: No lower extremity tenderness nor edema. Neurologic:  Normal speech and language. No gross focal neurologic deficits are appreciated. Skin:  Skin is warm, dry and intact. No rash  noted. Psychiatric: Mood and affect are normal. Speech and behavior are normal.  ____________________________________________   LABS (all labs ordered are listed, but only abnormal results are displayed)  Labs Reviewed  BASIC METABOLIC PANEL - Abnormal; Notable for the following components:      Result Value   Potassium 3.2 (*)    Glucose, Bld 109 (*)    All other components within normal limits  CBC - Abnormal; Notable for the following components:   Hemoglobin 12.5 (*)    HCT 38.0 (*)    MCV 71.8 (*)    MCH 23.6 (*)    All other components within normal limits  TROPONIN I (HIGH SENSITIVITY)   ____________________________________________  EKG  ED ECG REPORT I, Chesley Noon, the attending physician, personally viewed and interpreted this ECG.   Date: 04/13/2021  EKG Time: 18:37  Rate: 67  Rhythm: normal sinus rhythm  Axis: Normal  Intervals: Incomplete RBBB  ST&T Change: None   PROCEDURES  Procedure(s) performed (including Critical Care):  Procedures   ____________________________________________   INITIAL IMPRESSION / ASSESSMENT AND PLAN / ED COURSE      30 year old male with no significant past medical history presents to the ED complaining of intermittent then constant pain in the center of his chest for the past 3 to 4 days associated with nausea and some mild shortness of breath.  Patient is not in any respiratory distress and is maintaining O2 sats on room air.  EKG shows no evidence of arrhythmia or ischemia, troponin is negative and I doubt ACS given his constant symptoms for the past 2 days.  I doubt PE as he is PERC negative.  Chest x-ray reviewed by me and shows no infiltrate, edema, or effusion.  Patient reports nausea but has no abdominal tenderness on exam.  Given reassuring work-up, he is appropriate for discharge home with PCP follow-up.  He will be prescribed Zofran and was counseled to return to the ED for new or worsening symptoms.  Patient  agrees with plan.      ____________________________________________   FINAL CLINICAL IMPRESSION(S) / ED DIAGNOSES  Final diagnoses:  Chest pain, unspecified type  Nausea     ED Discharge Orders          Ordered    ondansetron (ZOFRAN ODT) 4 MG disintegrating tablet  Every 8 hours PRN        04/13/21 2229             Note:  This document was prepared using Dragon voice recognition software and may include unintentional dictation errors.    Chesley Noon, MD 04/13/21 [id number]

## 2021-04-13 NOTE — ED Triage Notes (Signed)
Pt reports for the past 3-4 days has had intermittent pressure to his right chest accompanied with SOB and dizziness. Pt states that for the last 2 days the pressure has been constant.

## 2022-05-24 ENCOUNTER — Emergency Department: Payer: Medicaid Other

## 2022-05-24 ENCOUNTER — Emergency Department
Admission: EM | Admit: 2022-05-24 | Discharge: 2022-05-24 | Disposition: A | Discharge status: Home / Self Care | Payer: Medicaid Other | Attending: Emergency Medicine | Admitting: Emergency Medicine

## 2022-05-24 ENCOUNTER — Other Ambulatory Visit: Payer: Self-pay

## 2022-05-24 DIAGNOSIS — S62339A Displaced fracture of neck of unspecified metacarpal bone, initial encounter for closed fracture: Secondary | ICD-10-CM

## 2022-05-24 DIAGNOSIS — S62325A Displaced fracture of shaft of fourth metacarpal bone, left hand, initial encounter for closed fracture: Secondary | ICD-10-CM | POA: Insufficient documentation

## 2022-05-24 DIAGNOSIS — W228XXA Striking against or struck by other objects, initial encounter: Secondary | ICD-10-CM | POA: Insufficient documentation

## 2022-05-24 NOTE — ED Triage Notes (Signed)
Pt presents to ED with c/o of L hand swelling to the top of his L hand and ring finger. Pt states he hit  it on something plastic, no abrasion noted. No swelling noted. Pt able to move all fingers at this time.

## 2022-05-24 NOTE — Discharge Instructions (Signed)
Please follow-up with orthopedics.  Keep your splint clean and dry.  Please return for any new, worsening, or change in symptoms or other concerns.  Keep your arm elevated.

## 2022-05-24 NOTE — ED Notes (Signed)
Pt wanting to file workers comp- pt given ineligibility form due to not having chain of custody paperwork or a supervisor present to tell what testing is needed. Explained to pt of new policy that was taken into effect and that his company should know about it.

## 2022-05-24 NOTE — ED Notes (Signed)
Pt provided dc ppw.followup and rx information reviewed as applicable. Pt provides verbal consent for dc as this time and is ambulatory to lobby 

## 2022-05-24 NOTE — ED Provider Notes (Signed)
[facility] Provider Note    Event Date/Time   First MD Initiated Contact with Patient 05/24/22 [id number]     (approximate)   History   Hand Injury (Left)   HPI  Barry Fitzgerald is a 31 y.o. male right-hand-dominant who presents today for evaluation of left hand injury.  Patient reports that his fingers got stuck in a loop on a knee board and pulled laterally.  He reports that he felt that he sprained his finger, but continued to have pain today, so he came in for evaluation.  He reports that he has pain over the lateral aspect of his hand.  He has not noted significant swelling.  No paresthesias.  No weakness.  There are no problems to display for this patient.         Physical Exam   Triage Vital Signs: ED Triage Vitals  Enc Vitals Group     BP 05/24/22 0845 112/74     Pulse Rate 05/24/22 0845 (!) 57     Resp 05/24/22 0845 17     Temp 05/24/22 0845 98.8 F (37.1 C)     Temp Source 05/24/22 0845 Oral     SpO2 05/24/22 0845 98 %     Weight 05/24/22 0940 169 lb 15.6 oz (77.1 kg)     Height 05/24/22 0940 6' (1.829 m)     Head Circumference --      Peak Flow --      Pain Score 05/24/22 0846 5     Pain Loc --      Pain Edu? --      Excl. in GC? --     Most recent vital signs: Vitals:   05/24/22 0845  BP: 112/74  Pulse: (!) 57  Resp: 17  Temp: 98.8 F (37.1 C)  SpO2: 98%    Physical Exam Vitals and nursing note reviewed.  Constitutional:      General: Awake and alert. No acute distress.    Appearance: Normal appearance. The patient is normal weight.  HENT:     Head: Normocephalic and atraumatic.     Mouth: Mucous membranes are moist.  Eyes:     General: PERRL. Normal EOMs        Right eye: No discharge.        Left eye: No discharge.     Conjunctiva/sclera: Conjunctivae normal.  Cardiovascular:     Rate and Rhythm: Normal rate and regular rhythm.     Pulses: Normal pulses.     Heart sounds: Normal heart sounds Pulmonary:      Effort: Pulmonary effort is normal. No respiratory distress.     Breath sounds: Normal breath sounds.  Abdominal:     Abdomen is soft. There is no abdominal tenderness. No rebound or guarding. No distention. Musculoskeletal:        General: No swelling. Normal range of motion.     Cervical back: Normal range of motion and neck supple.  Left hand: TTP to dorsum of left hand over the 4th metacarpal. No malrotation. No deformity.  No ecchymosis.  Mild swelling.  No tenderness to his wrist or forearm.  Normal radial pulse Skin:    General: Skin is warm and dry.     Capillary Refill: Capillary refill takes less than 2 seconds.     Findings: No rash.  Neurological:     Mental Status: The patient is awake and alert.      ED Results / Procedures / Treatments  Labs (all labs ordered are listed, but only abnormal results are displayed) Labs Reviewed - No data to display   EKG     RADIOLOGY I independently reviewed and interpreted imaging and agree with radiologists findings.     PROCEDURES:  Critical Care performed:   Procedures   MEDICATIONS ORDERED IN ED: Medications - No data to display   IMPRESSION / MDM / ASSESSMENT AND PLAN / ED COURSE  I reviewed the triage vital signs and the nursing notes.   Differential diagnosis includes, but is not limited to, fracture, dislocation, contusion, sprain.  Patient is awake and alert, hemodynamically stable and neurovascularly intact.  No malrotation noted on exam.  X-ray demonstrates a nondisplaced comminuted fracture in the shaft of the left fourth metacarpal.  He was placed in an ulnar gutter splint and instructed to follow-up with orthopedics.  He remained neurovascularly intact both before and after splint placement.  He was given the appropriate follow-up information.  We discussed splint care and return precautions.  Patient understands and agrees with plan.  Discharged in stable condition.   Patient's presentation is most  consistent with acute complicated illness / injury requiring diagnostic workup.     FINAL CLINICAL IMPRESSION(S) / ED DIAGNOSES   Final diagnoses:  Closed boxer's fracture, initial encounter     Rx / DC Orders   ED Discharge Orders     None        Note:  This document was prepared using Dragon voice recognition software and may include unintentional dictation errors.   Keturah Shavers 05/24/22 1122    Minna Antis, MD 05/24/22 1146

## 2022-06-11 ENCOUNTER — Ambulatory Visit: Payer: Worker's Compensation | Attending: Orthopedic Surgery | Admitting: Occupational Therapy

## 2022-06-11 ENCOUNTER — Encounter: Payer: Self-pay | Admitting: Occupational Therapy

## 2022-06-11 DIAGNOSIS — S62355A Nondisplaced fracture of shaft of fourth metacarpal bone, left hand, initial encounter for closed fracture: Secondary | ICD-10-CM | POA: Insufficient documentation

## 2022-06-11 NOTE — Therapy (Signed)
[facility] PHYS[facility] SPORTS MEDICINE [address], [place], [address] Phone: [phone]   Fax:  [phone]  Occupational Therapy Evaluation  Patient Details  Name: Barry Fitzgerald MRN: [id number] Date of Birth: [birth year] Referring Provider (OT): [place]   Encounter Date: 06/11/2022   OT End of Session - 06/11/22 1425     Visit Number 1    Number of Visits 2    Date for OT Re-Evaluation 07/09/22    OT Start Time 0945    OT Stop Time 1015    OT Time Calculation (min) 30 min    Activity Tolerance Patient tolerated treatment well    Behavior During Therapy [facility] for tasks assessed/performed             History reviewed. No pertinent past medical history.  Past Surgical History:  Procedure Laterality Date   ABDOMINAL SURGERY     SPLENECTOMY      There were no vitals filed for this visit.   Subjective Assessment - 06/11/22 1117     Subjective  I injured my hand about 3 to 4 weeks ago when I grabbed by a shovel and my hand got twisted.  A fist already sprained it but then I went to the doctor the next day and it was fractured    Pertinent History Patient seen on 06/06/22 for evaluation of a left hand metacarpal shaft fracture that occurred  around 05/24/22. Patient was seen in the ER-, suffered a metacarpal shaft fracture at work, states he was using a shovel when it happened -  caught his fourth and fifth digit in a handle and hyper abducted the fourth and fifth digit causing pain to the shaft of the fourth metacarpal. He was seen in the ER had x-rays obtained and was placed into a splint. Is been taken ibuprofen for pain. He is right-hand dominant. He typically performs a lot of heavy lifting pushing and pulling at work, sometimes lifting up to 100 pounds.  - pt refer to OT for boxer custom splint for non displaced 4th digit MC shaft fx    Patient Stated Goals It was also much better to get out of this and getting a smaller splint.     Currently in Pain? Yes    Pain Score 1     Pain Location Hand    Pain Orientation Left    Pain Descriptors / Indicators Aching;Sore    Pain Type Acute pain               [facility] OT Assessment - 06/11/22 0001       Assessment   Medical Diagnosis Left fourth digit metacarpal shaft nondisplaced fracture    Referring Provider (OT) Floyce Stakes PA    Onset Date/Surgical Date 05/24/22    Hand Dominance Right    Next MD Visit 06/27/22      Restrictions   Other Position/Activity Restrictions Light duty at work 5 pounds limit-splint on at all times      Home  Environment   Lives With Family      Prior Function   Vocation Full time employment    Leisure Married 3 children 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    Baseline Patient able to don and doff splint independently can call me if needed for adjustment.    Time 4    Period Weeks    Status New    Target Date 07/09/22                   Plan - 06/11/22 1426     Clinical Impression Statement Patient presented OT evaluation with a diagnosis of left fourth metacarpal shaft nondisplaced fracture.  Patient is about 2-1/2 weeks out from injury.  Patient referred to OT for fabrication of a custom hand-based boxer splint to protect nondisplaced fracture until appointment with orthopedics again.  Patient arrived with soft forearm cast on.  Removed and patient was placed in about fourth and fifth MC flexion  of 50 degrees including in boxer ulnar gutter splint for the fifth digits hand.  Patient was reminded again on precautions and wearing instructions as to follow orthopedics instructions.  Patient was able to don and fasten straps correctly.  Patient to follow-up with me as needed if splint needs adjustment.  Otherwise we will follow-up with orthopedics on 27 June 2022.    OT Occupational Profile and History Problem Focused Assessment - Including review of records relating to presenting problem    Occupational performance deficits (Please refer to evaluation for details): ADL's;IADL's;Work;Play;Leisure;Social Participation    Body Structure / Function / Physical Skills ADL;Strength;UE functional use;Edema;Flexibility;Decreased knowledge of precautions    Rehab Potential Good    Clinical Decision Making Limited treatment options, no task modification necessary    Comorbidities Affecting Occupational Performance: None    Modification or Assistance to Complete Evaluation  No modification of tasks or assist necessary to complete eval    OT Frequency --   2 visits splint adjustments   OT Duration 4 weeks    OT Treatment/Interventions Splinting;Patient/family education;Therapeutic exercise    Consulted and Agree with Plan of Care Patient             Patient will benefit from skilled therapeutic intervention in order to improve the following deficits and impairments:   Body Structure / Function / Physical Skills: ADL, Strength, UE functional use, Edema, Flexibility, Decreased knowledge of precautions       Visit Diagnosis: Nondisplaced fracture of shaft of fourth metacarpal bone, left hand, initial encounter for closed fracture - Plan: Ot plan of care cert/re-cert    Problem List There are no problems to display for this patient.   Oletta Cohn, OTR/L,CLT 06/11/2022, 2:30 PM  [facility] PHYSICAL AND SPORTS MEDICINE [address], [place], [address] Phone: [phone]   Fax:  [phone]  Name: Barry Fitzgerald MRN: [id number] Date of Birth: [birth year]

## 2023-06-10 ENCOUNTER — Emergency Department: Payer: Medicaid Other

## 2023-06-10 ENCOUNTER — Other Ambulatory Visit: Payer: Self-pay

## 2023-06-10 ENCOUNTER — Emergency Department
Admission: EM | Admit: 2023-06-10 | Discharge: 2023-06-10 | Disposition: A | Discharge status: Home / Self Care | Payer: Medicaid Other | Attending: Emergency Medicine | Admitting: Emergency Medicine

## 2023-06-10 DIAGNOSIS — R0789 Other chest pain: Secondary | ICD-10-CM | POA: Insufficient documentation

## 2023-06-10 DIAGNOSIS — Y92410 Unspecified street and highway as the place of occurrence of the external cause: Secondary | ICD-10-CM | POA: Insufficient documentation

## 2023-06-10 DIAGNOSIS — S40212A Abrasion of left shoulder, initial encounter: Secondary | ICD-10-CM | POA: Insufficient documentation

## 2023-06-10 DIAGNOSIS — M25512 Pain in left shoulder: Secondary | ICD-10-CM

## 2023-06-10 DIAGNOSIS — T07XXXA Unspecified multiple injuries, initial encounter: Secondary | ICD-10-CM

## 2023-06-10 MED ORDER — OXYCODONE-ACETAMINOPHEN 5-325 MG PO TABS: 1.0000 | ORAL_TABLET | Freq: Three times a day (TID) | ORAL | 0 refills | Status: AC | PRN

## 2023-06-10 MED ORDER — KETOROLAC TROMETHAMINE 30 MG/ML IJ SOLN
30.0000 mg | Freq: Once | INTRAMUSCULAR | Status: AC
  Administered 2023-06-10: 30 mg via INTRAMUSCULAR
  Filled 2023-06-10: qty 1

## 2023-06-10 MED ORDER — BACITRACIN ZINC 500 UNIT/GM EX OINT
TOPICAL_OINTMENT | Freq: Once | CUTANEOUS | Status: AC
  Filled 2023-06-10: qty 0.9

## 2023-06-10 MED ORDER — LIDOCAINE-EPINEPHRINE-TETRACAINE (LET) TOPICAL GEL
6.0000 mL | Freq: Once | TOPICAL | Status: AC
  Administered 2023-06-10: 6 mL via TOPICAL
  Filled 2023-06-10: qty 6

## 2023-06-10 MED ORDER — HYDROCODONE-ACETAMINOPHEN 5-325 MG PO TABS
1.0000 | ORAL_TABLET | Freq: Once | ORAL | Status: AC
  Administered 2023-06-10: 1 via ORAL
  Filled 2023-06-10: qty 1

## 2023-06-10 MED ORDER — CYCLOBENZAPRINE HCL 5 MG PO TABS: 5.0000 mg | ORAL_TABLET | Freq: Three times a day (TID) | ORAL | 0 refills | Status: DC | PRN

## 2023-06-10 MED ORDER — IBUPROFEN 800 MG PO TABS: 800.0000 mg | ORAL_TABLET | Freq: Three times a day (TID) | ORAL | 0 refills | Status: DC | PRN

## 2023-06-10 MED ORDER — CYCLOBENZAPRINE HCL 10 MG PO TABS
10.0000 mg | ORAL_TABLET | Freq: Once | ORAL | Status: AC
  Administered 2023-06-10: 10 mg via ORAL
  Filled 2023-06-10: qty 1

## 2023-06-10 MED ORDER — ONDANSETRON 4 MG PO TBDP
4.0000 mg | ORAL_TABLET | Freq: Once | ORAL | Status: AC
  Administered 2023-06-10: 4 mg via ORAL
  Filled 2023-06-10: qty 1
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
## 2023-06-10 NOTE — ED Notes (Signed)
Left shoulder wound washed, dried, antibiotic ointment applied, teflon applied. Pt given extra supplies to change teflon at home.
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
## 2023-09-11 ENCOUNTER — Emergency Department
Admission: EM | Admit: 2023-09-11 | Discharge: 2023-09-11 | Disposition: A | Discharge status: Home / Self Care | Payer: BC Managed Care – PPO | Attending: Emergency Medicine | Admitting: Emergency Medicine

## 2023-09-11 ENCOUNTER — Emergency Department: Payer: BC Managed Care – PPO

## 2023-09-11 ENCOUNTER — Other Ambulatory Visit: Payer: Self-pay

## 2023-09-11 DIAGNOSIS — R1084 Generalized abdominal pain: Secondary | ICD-10-CM | POA: Diagnosis not present

## 2023-09-11 DIAGNOSIS — R350 Frequency of micturition: Secondary | ICD-10-CM | POA: Diagnosis not present

## 2023-09-11 DIAGNOSIS — R1030 Lower abdominal pain, unspecified: Secondary | ICD-10-CM | POA: Diagnosis present

## 2023-09-11 LAB — COMPREHENSIVE METABOLIC PANEL
ALT: 19 U/L (ref 0–44)
AST: 21 U/L (ref 15–41)
Albumin: 4.6 g/dL (ref 3.5–5.0)
Alkaline Phosphatase: 72 U/L (ref 38–126)
Anion gap: 9 (ref 5–15)
BUN: 16 mg/dL (ref 6–20)
CO2: 24 mmol/L (ref 22–32)
Calcium: 9.2 mg/dL (ref 8.9–10.3)
Chloride: 106 mmol/L (ref 98–111)
Creatinine, Ser: 0.89 mg/dL (ref 0.61–1.24)
GFR, Estimated: >60 mL/min (ref >60)
Glucose, Bld: 97 mg/dL (ref 70–99)
Potassium: 3.9 mmol/L (ref 3.5–5.1)
Sodium: 139 mmol/L (ref 135–145)
Total Bilirubin: 1 mg/dL (ref <1.2)
Total Protein: 7.3 g/dL (ref 6.5–8.1)

## 2023-09-11 LAB — CBC
HCT: 40.4 % (ref 39.0–52.0)
Hemoglobin: 13 g/dL (ref 13.0–17.0)
MCH: 23.5 pg — ABNORMAL LOW (ref 26.0–34.0)
MCHC: 32.2 g/dL (ref 30.0–36.0)
MCV: 72.9 fL — ABNORMAL LOW (ref 80.0–100.0)
Platelets: 363 10*3/uL (ref 150–400)
RBC: 5.54 MIL/uL (ref 4.22–5.81)
RDW: 14.7 % (ref 11.5–15.5)
WBC: 4.7 10*3/uL (ref 4.0–10.5)
nRBC: 0 % (ref 0.0–0.2)

## 2023-09-11 LAB — URINALYSIS, ROUTINE W REFLEX MICROSCOPIC
Bilirubin Urine: NEGATIVE
Glucose, UA: NEGATIVE mg/dL
Hgb urine dipstick: NEGATIVE
Ketones, ur: NEGATIVE mg/dL
Leukocytes,Ua: NEGATIVE
Nitrite: NEGATIVE
Protein, ur: NEGATIVE mg/dL
Specific Gravity, Urine: 1.008 (ref 1.005–1.030)
pH: 7 (ref 5.0–8.0)

## 2023-09-11 LAB — LIPASE, BLOOD: Lipase: 42 U/L (ref 11–51)

## 2023-09-11 MED ORDER — IOHEXOL 300 MG/ML  SOLN
100.0000 mL | Freq: Once | INTRAMUSCULAR | Status: AC | PRN
  Administered 2023-09-11: 100 mL via INTRAVENOUS
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
## 2023-09-11 NOTE — ED Triage Notes (Signed)
Pt reports woke up with lower abd pain. Reports increased urination. Denies n/v/d

## 2023-09-11 NOTE — ED Notes (Signed)
Pt. To CT

## 2023-09-11 NOTE — Discharge Instructions (Signed)
Follow-up with one of the urgent cares or return to the emergency department if any severe worsening of your symptoms. A list of offices that are taking new patients are listed on your discharge papers so that you can establish a primary care provider.  Tylenol if needed for pain.  Increase fluids to stay hydrated.  No lifting, pushing, pulling or reaching above shoulder level.

## 2024-01-14 ENCOUNTER — Other Ambulatory Visit: Payer: Self-pay

## 2024-01-14 ENCOUNTER — Emergency Department
Admission: EM | Admit: 2024-01-14 | Discharge: 2024-01-14 | Disposition: A | Discharge status: Home / Self Care | Attending: Emergency Medicine | Admitting: Emergency Medicine

## 2024-01-14 DIAGNOSIS — R519 Headache, unspecified: Secondary | ICD-10-CM | POA: Diagnosis present

## 2024-01-14 DIAGNOSIS — J101 Influenza due to other identified influenza virus with other respiratory manifestations: Secondary | ICD-10-CM | POA: Insufficient documentation

## 2024-01-14 LAB — RESP PANEL BY RT-PCR (RSV, FLU A&B, COVID)  RVPGX2
Influenza A by PCR: NEGATIVE
Influenza B by PCR: POSITIVE — AB
Resp Syncytial Virus by PCR: NEGATIVE
SARS Coronavirus 2 by RT PCR: NEGATIVE

## 2024-01-14 NOTE — ED Provider Notes (Signed)
 [facility] Provider Note    Event Date/Time   First MD Initiated Contact with Patient 01/14/24 [id number]     (approximate)   History   Headache   HPI  Barry Fitzgerald is a 33 y.o. male presents to the ED with complaint of cough, congestion, body aches for the last 3 days.  Patient denies any nausea, vomiting or diarrhea.  He is aware that he was exposed to someone with the flu at work.  No over-the-counter medications have been taken.     Physical Exam   Triage Vital Signs: ED Triage Vitals  Encounter Vitals Group     BP 01/14/24 0709 117/74     Systolic BP Percentile --      Diastolic BP Percentile --      Pulse Rate 01/14/24 0709 77     Resp 01/14/24 0709 20     Temp 01/14/24 0709 98.2 F (36.8 C)     Temp Source 01/14/24 0709 Oral     SpO2 01/14/24 0709 99 %     Weight --      Height --      Head Circumference --      Peak Flow --      Pain Score 01/14/24 0710 2     Pain Loc --      Pain Education --      Exclude from Growth Chart --     Most recent vital signs: Vitals:   01/14/24 0709  BP: 117/74  Pulse: 77  Resp: 20  Temp: 98.2 F (36.8 C)  SpO2: 99%     General: Awake, no distress.  CV:  Good peripheral perfusion.  Heart regular rate and rhythm. Resp:  Normal effort.  Lungs clear bilaterally. Abd:  No distention.  Other:     ED Results / Procedures / Treatments   Labs (all labs ordered are listed, but only abnormal results are displayed) Labs Reviewed  RESP PANEL BY RT-PCR (RSV, FLU A&B, COVID)  RVPGX2 - Abnormal; Notable for the following components:      Result Value   Influenza B by PCR POSITIVE (*)    All other components within normal limits     PROCEDURES:  Critical Care performed:   Procedures   MEDICATIONS ORDERED IN ED: Medications - No data to display   IMPRESSION / MDM / ASSESSMENT AND PLAN / ED COURSE  I reviewed the triage vital signs and the nursing notes.   Differential diagnosis  includes, but is not limited to, influenza, COVID, RSV, viral illness, seasonal allergies.  33 year old male presents to the ED with complaint of upper respiratory symptoms and bodyaches for the last 3 days.  Patient has been exposed to influenza.  He was made aware that his test was positive for influenza B.  He is encouraged to drink lots of fluids, Tylenol or ibuprofen as needed for body aches or fever.  A note was written for him to remain out of work.  Follow-up with his PCP or urgent care if any continued problems.      Patient's presentation is most consistent with acute complicated illness / injury requiring diagnostic workup.  FINAL CLINICAL IMPRESSION(S) / ED DIAGNOSES   Final diagnoses:  Influenza B     Rx / DC Orders   ED Discharge Orders     None        Note:  This document was prepared using Dragon voice recognition software and may include unintentional dictation  errors.   Stafford Eagles, PA-C 01/14/24 1201    Kandee Orion, MD 01/15/24 [phone]

## 2024-01-14 NOTE — ED Notes (Signed)
 See triage note  Presents with headache,cough and body aches   States sxs' started on Saturday Afebrile on arrival

## 2024-01-14 NOTE — ED Triage Notes (Signed)
 Pt to ED via POV from home. Pt reports feeling sick since Saturday. Pt reports cough, chest tightness, HA. Pt denies N/V/D or abd pain. Pt reports +  Flu contact at work.

## 2024-01-14 NOTE — Discharge Instructions (Signed)
 Follow-up with your primary care provider or urgent care if any continued problems.  Tylenol or ibuprofen as needed for body aches, headache, fever.  Increase fluids to stay hydrated.  Over-the-counter cough medication or congestion medication to help with your symptoms.  At this time you are considered contagious.
